# Patient Record
Sex: Female | Born: 2000 | Hispanic: Yes | Marital: Single | Smoking: Never smoker
Health system: Southern US, Community
[De-identification: ages and names within clinical notes are randomized; demographics above are authoritative.]

---
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
## 2023-03-18 ENCOUNTER — Ambulatory Visit
Admission: EM | Admit: 2023-03-18 | Discharge: 2023-03-18 | Disposition: A | Discharge status: Home / Self Care | Attending: Emergency Medicine | Admitting: Emergency Medicine

## 2023-03-18 ENCOUNTER — Encounter: Payer: Self-pay | Admitting: Emergency Medicine

## 2023-03-18 ENCOUNTER — Other Ambulatory Visit: Payer: Self-pay

## 2023-03-18 DIAGNOSIS — N3000 Acute cystitis without hematuria: Secondary | ICD-10-CM | POA: Insufficient documentation

## 2023-03-18 DIAGNOSIS — K219 Gastro-esophageal reflux disease without esophagitis: Secondary | ICD-10-CM | POA: Diagnosis present

## 2023-03-18 LAB — POCT URINALYSIS DIP (MANUAL ENTRY)
Bilirubin, UA: NEGATIVE
Blood, UA: NEGATIVE
Glucose, UA: 100 mg/dL — AB
Leukocytes, UA: NEGATIVE
Nitrite, UA: POSITIVE — AB
Protein Ur, POC: 30 mg/dL — AB
Spec Grav, UA: 1.015
Urobilinogen, UA: 1 U/dL
pH, UA: 5

## 2023-03-18 LAB — POCT URINE PREGNANCY: Preg Test, Ur: NEGATIVE

## 2023-03-18 MED ORDER — FAMOTIDINE 20 MG PO TABS: 20.0000 mg | ORAL_TABLET | Freq: Two times a day (BID) | ORAL | 0 refills | Status: AC

## 2023-03-18 MED ORDER — ALUM & MAG HYDROXIDE-SIMETH 200-200-20 MG/5ML PO SUSP: 30.0000 mL | Freq: Once | ORAL | Status: DC

## 2023-03-18 MED ORDER — LIDOCAINE VISCOUS HCL 2 % MT SOLN: 15.0000 mL | Freq: Once | OROMUCOSAL | Status: DC

## 2023-03-18 MED ORDER — NITROFURANTOIN MONOHYD MACRO 100 MG PO CAPS: 100.0000 mg | ORAL_CAPSULE | Freq: Two times a day (BID) | ORAL | 0 refills | Status: AC

## 2023-03-18 NOTE — ED Triage Notes (Addendum)
 Patient presents to [facility] for evaluation of lower abdominal cramping and back pain x 2 days.  She has a history of "getting trapped gas" that can cause the back pain, but never with the lower abdominal pain.  Started AZO yesterday, concerned that it was a UTI.  No back pain currently, lower abdominal pain 2/10.  Back pain was resolved with TUMS.  Denies n/v. LMP 2/10.  Would like to be checked for STIs.  Is in a committed relationship, but understands there is always risk.

## 2023-03-18 NOTE — ED Provider Notes (Signed)
 Renaldo Fiddler    CSN: [id number] Arrival date & time: 03/18/23  1535      History   Chief Complaint Chief Complaint  Patient presents with   Abdominal Pain    HPI Tim Candy is a 23 y.o. female.   Patient presents for evaluation of lower abdominal pain and urinary urgency present for 2 days.  Has been experiencing intermittent low back pain for 5 days which she says is normal, improved with use of Tums.  Has had increased belching and gas production, has been straining with bowel movements last occurrence 1 to 2 days ago.  Lower abdominal pain is described as cramping.  Denies nausea or vomiting, tolerating food and liquids.  Additionally has been taking Azo as she is concerned for urinary infection.  Endorses monogamy within relationship, no concern for STI but requesting full panel as a precaution.  Has menstrual period 02/14/2023.  History reviewed. No pertinent past medical history.  There are no active problems to display for this patient.   History reviewed. No pertinent surgical history.  OB History   No obstetric history on file.      Home Medications    Prior to Admission medications   Medication Sig Start Date End Date Taking? Authorizing Provider  famotidine (PEPCID) 20 MG tablet Take 1 tablet (20 mg total) by mouth 2 (two) times daily. 03/18/23  Yes Arihana Ambrocio R, NP  nitrofurantoin, macrocrystal-monohydrate, (MACROBID) 100 MG capsule Take 1 capsule (100 mg total) by mouth 2 (two) times daily. 03/18/23  Yes Valinda Hoar, NP    Family History History reviewed. No pertinent family history.  Social History Social History   Tobacco Use   Smoking status: Never  Substance Use Topics   Alcohol use: No   Drug use: Never     Allergies   Patient has no known allergies.   Review of Systems Review of Systems   Physical Exam Triage Vital Signs ED Triage Vitals  Encounter Vitals Group     BP 03/18/23 1550 116/75     Systolic BP  Percentile --      Diastolic BP Percentile --      Pulse Rate 03/18/23 1550 91     Resp 03/18/23 1550 18     Temp 03/18/23 1550 (!) 97.3 F (36.3 C)     Temp Source 03/18/23 1550 Temporal     SpO2 03/18/23 1550 95 %     Weight --      Height --      Head Circumference --      Peak Flow --      Pain Score 03/18/23 1551 2     Pain Loc --      Pain Education --      Exclude from Growth Chart --    No data found.  Updated Vital Signs BP 116/75 (BP Location: Left Arm)   Pulse 91   Temp (!) 97.3 F (36.3 C) (Temporal)   Resp 18   LMP 02/14/2023   SpO2 95%   Visual Acuity Right Eye Distance:   Left Eye Distance:   Bilateral Distance:    Right Eye Near:   Left Eye Near:    Bilateral Near:     Physical Exam Constitutional:      Appearance: Normal appearance.  Eyes:     Extraocular Movements: Extraocular movements intact.  Pulmonary:     Effort: Pulmonary effort is normal.  Abdominal:     General: Abdomen is flat.  Bowel sounds are normal.     Palpations: Abdomen is soft.     Tenderness: There is abdominal tenderness in the suprapubic area.  Neurological:     Mental Status: She is alert and oriented to person, place, and time. Mental status is at baseline.      UC Treatments / Results  Labs (all labs ordered are listed, but only abnormal results are displayed) Labs Reviewed  POCT URINALYSIS DIP (MANUAL ENTRY) - Abnormal; Notable for the following components:      Result Value   Color, UA orange (*)    Glucose, UA =100 (*)    Ketones, POC UA trace (5) (*)    Protein Ur, POC =30 (*)    Nitrite, UA Positive (*)    All other components within normal limits  POCT URINE PREGNANCY - Normal  CERVICOVAGINAL ANCILLARY ONLY    EKG   Radiology No results found.  Procedures Procedures (including critical care time)  Medications Ordered in UC Medications - No data to display  Initial Impression / Assessment and Plan / UC Course  I have reviewed the triage  vital signs and the nursing notes.  Pertinent labs & imaging results that were available during my care of the patient were reviewed by me and considered in my medical decision making (see chart for details).  Acute cystitis without hematuria, GERD without esophagitis  Urinalysis shows nitrates, sent for culture, urine pregnancy negative, cytology for STI pending, will treat per protocol, prescribed Macrobid and recommended additional supportive care, advised abstinence until all lab results, may continue use of Azo as needed, may follow-up for persisting or recurring symptoms  Lower back pain and increased gas production symptoms are consistent with GERD, prescribed famotidine and recommended additional supportive measures, advised a bland diet until symptoms resolve, may follow-up with urgent care as needed Final Clinical Impressions(s) / UC Diagnoses   Final diagnoses:  Acute cystitis without hematuria  Gastroesophageal reflux disease without esophagitis     Discharge Instructions      Your urinalysis shows Giavanni Zeitlin blood cells and nitrates which are indicative of infection, your urine will be sent to the lab to determine exactly which bacteria is present, if any changes need to be made to your medications you will be notified  Begin use of macrobid every morning and every evening for 5 days  You may use over-the-counter Azo to help minimize your symptoms until antibiotic removes bacteria, this medication will turn your urine orange  Increase your fluid intake through use of water  As always practice good hygiene, wiping front to back and avoidance of scented vaginal products to prevent further irritation  If symptoms continue to persist after use of medication or recur please follow-up with urgent care or your primary doctor as needed   Back pain, increased belching are all signs of acid reflux and indigestion  Take famotidine twice daily for up to 2 weeks to help calm stomach  acid and reduce symptoms  While symptoms are present eat a bland diet with avoidance of spicy and greasy foods, foods heavily based and tomato and onion as well as high caffeine  After meals set up for at least 30 minutes to help food further digest  May follow-up with urgent care as needed    ED Prescriptions     Medication Sig Dispense Auth. Provider   nitrofurantoin, macrocrystal-monohydrate, (MACROBID) 100 MG capsule Take 1 capsule (100 mg total) by mouth 2 (two) times daily. 10 capsule [place], [place],  NP   famotidine (PEPCID) 20 MG tablet Take 1 tablet (20 mg total) by mouth 2 (two) times daily. 30 tablet Valinda Hoar, NP      PDMP not reviewed this encounter.   Valinda Hoar, NP 03/18/23 1625

## 2023-03-18 NOTE — Discharge Instructions (Signed)
 Your urinalysis shows Jaime Stevens blood cells and nitrates which are indicative of infection, your urine will be sent to the lab to determine exactly which bacteria is present, if any changes need to be made to your medications you will be notified  Begin use of macrobid every morning and every evening for 5 days  You may use over-the-counter Azo to help minimize your symptoms until antibiotic removes bacteria, this medication will turn your urine orange  Increase your fluid intake through use of water  As always practice good hygiene, wiping front to back and avoidance of scented vaginal products to prevent further irritation  If symptoms continue to persist after use of medication or recur please follow-up with urgent care or your primary doctor as needed   Back pain, increased belching are all signs of acid reflux and indigestion  Take famotidine twice daily for up to 2 weeks to help calm stomach acid and reduce symptoms  While symptoms are present eat a bland diet with avoidance of spicy and greasy foods, foods heavily based and tomato and onion as well as high caffeine  After meals set up for at least 30 minutes to help food further digest  May follow-up with urgent care as needed

## 2023-03-21 LAB — CERVICOVAGINAL ANCILLARY ONLY
Bacterial Vaginitis (gardnerella): NEGATIVE
Candida Glabrata: NEGATIVE
Candida Vaginitis: NEGATIVE
Chlamydia: NEGATIVE
Comment: NEGATIVE
Comment: NEGATIVE
Comment: NEGATIVE
Comment: NEGATIVE
Comment: NEGATIVE
Comment: NORMAL
Neisseria Gonorrhea: NEGATIVE
Trichomonas: NEGATIVE
